# Patient Record
Sex: Male | Born: 2000 | Race: White | Hispanic: No | Marital: Single | Smoking: Never smoker
Health system: Southern US, Community
[De-identification: ages and names within clinical notes are randomized; demographics above are authoritative.]

---

## 2020-09-05 ENCOUNTER — Encounter (HOSPITAL_COMMUNITY): Payer: Self-pay | Admitting: Emergency Medicine

## 2020-09-05 ENCOUNTER — Emergency Department (HOSPITAL_COMMUNITY)
Admission: EM | Admit: 2020-09-05 | Discharge: 2020-09-05 | Disposition: A | Discharge status: Home / Self Care | Payer: Self-pay | Attending: Emergency Medicine | Admitting: Emergency Medicine

## 2020-09-05 ENCOUNTER — Other Ambulatory Visit: Payer: Self-pay

## 2020-09-05 ENCOUNTER — Emergency Department (HOSPITAL_COMMUNITY): Payer: Self-pay

## 2020-09-05 DIAGNOSIS — U071 COVID-19: Secondary | ICD-10-CM | POA: Insufficient documentation

## 2020-09-05 DIAGNOSIS — R519 Headache, unspecified: Secondary | ICD-10-CM

## 2020-09-05 LAB — RESP PANEL BY RT-PCR (FLU A&B, COVID) ARPGX2
Influenza A by PCR: NEGATIVE
Influenza B by PCR: NEGATIVE
SARS Coronavirus 2 by RT PCR: POSITIVE — AB

## 2020-09-05 NOTE — Discharge Instructions (Signed)
Please read and follow all provided instructions.  Your diagnoses today include:  1. Acute nonintractable headache, unspecified headache type   2. COVID-19     Tests performed today include: CT of your head which was normal and did not show any serious cause of your headache Vital signs. See below for your results today.   Medications:  Please use over-the-counter NSAID medications (ibuprofen, naproxen) as directed on the packaging for pain.   Take any prescribed medications only as directed.  Additional information:  Follow any educational materials contained in this packet.  You are having a headache. No specific cause was found today for your headache. It may have been a migraine or other cause of headache. Stress, anxiety, fatigue, and depression are common triggers for headaches.   Your headache today does not appear to be life-threatening or require hospitalization, but often the exact cause of headaches is not determined in the emergency department. Therefore, follow-up with your doctor is very important to find out what may have caused your headache and whether or not you need any further diagnostic testing or treatment.   Sometimes headaches can appear benign (not harmful), but then more serious symptoms can develop which should prompt an immediate re-evaluation by your doctor or the emergency department.  BE VERY CAREFUL not to take multiple medicines containing Tylenol (also called acetaminophen). Doing so can lead to an overdose which can damage your liver and cause liver failure and possibly death.   Follow-up instructions: Please follow-up with your primary care provider as needed for further evaluation of your symptoms.   Return instructions:  Please return to the Emergency Department if you experience worsening symptoms. Return if the medications do not resolve your headache, if it recurs, or if you have multiple episodes of vomiting or cannot keep down fluids. Return  if you have a change from the usual headache. RETURN IMMEDIATELY IF you: Develop a sudden, severe headache Develop confusion or become poorly responsive or faint Develop a fever above 100.73F or problem breathing Have a change in speech, vision, swallowing, or understanding Develop new weakness, numbness, tingling, incoordination in your arms or legs Have a seizure Please return if you have any other emergent concerns.  Additional Information:  Your vital signs today were: BP (!) 141/87 (BP Location: Right Arm)   Pulse 74   Temp 98.4 F (36.9 C) (Oral)   Resp 16   SpO2 100%  If your blood pressure (BP) was elevated above 135/85 this visit, please have this repeated by your doctor within one month. --------------

## 2020-09-05 NOTE — ED Provider Notes (Signed)
[facility] EMERGENCY DEPARTMENT Provider Note   CSN: [id number] Arrival date & time: 09/05/20  1335     History Chief Complaint  Patient presents with   Headache    Patrick [name] is a 20 y.o. male.  Patient presents for evaluation of left-sided posterior headaches ongoing over the past 6 days.  Patient states that he has intermittent sharp pains which are severe for 15 to 20 minutes and then more dull pain lasting for another 15 or 20 minutes to the left occipital area.  Seems to be worse when he gets his heart rate up or exercises.  No lightheadedness or syncope.  No focal neurodeficits. Patient denies signs of stroke including: facial droop, slurred speech, aphasia, weakness/numbness in extremities, imbalance/trouble walking.  He takes ibuprofen which does seem to help.  No fever, infectious symptoms, neck pain.  No history of similar headaches.  No head injury.  Recent COVID exposure to her roommate.  Denies family history of aneurysms or strokes.  The onset of this condition was acute. The course is constant. Aggravating factors: exertion. Alleviating factors: none.        History reviewed. No pertinent past medical history.  There are no problems to display for this patient.   History reviewed. No pertinent surgical history.     No family history on file.  Social History   Tobacco Use   Smoking status: Never   Smokeless tobacco: Never  Substance Use Topics   Alcohol use: Yes   Drug use: Not Currently    Home Medications Prior to Admission medications   Not on File    Allergies    Patient has no known allergies.  Review of Systems   Review of Systems  Constitutional:  Negative for fever.  HENT:  Negative for congestion, dental problem, rhinorrhea and sinus pressure.   Eyes:  Positive for photophobia. Negative for discharge, redness and visual disturbance.  Respiratory:  Negative for shortness of breath.   Cardiovascular:  Negative for chest  pain.  Gastrointestinal:  Negative for nausea and vomiting.  Musculoskeletal:  Negative for gait problem, neck pain and neck stiffness.  Skin:  Negative for rash.  Neurological:  Positive for headaches. Negative for syncope, speech difficulty, weakness, light-headedness and numbness.  Psychiatric/Behavioral:  Negative for confusion.    Physical Exam Updated Vital Signs BP (!) 141/87 (BP Location: Right Arm)   Pulse 74   Temp 98.4 F (36.9 C) (Oral)   Resp 16   SpO2 100%   Physical Exam Vitals and nursing note reviewed.  Constitutional:      Appearance: He is well-developed.  HENT:     Head: Normocephalic and atraumatic.     Right Ear: Tympanic membrane, ear canal and external ear normal.     Left Ear: Tympanic membrane, ear canal and external ear normal.     Nose: Nose normal.     Mouth/Throat:     Pharynx: Uvula midline.  Eyes:     General: Lids are normal.     Conjunctiva/sclera: Conjunctivae normal.     Pupils: Pupils are equal, round, and reactive to light.  Cardiovascular:     Rate and Rhythm: Normal rate and regular rhythm.  Pulmonary:     Effort: Pulmonary effort is normal.     Breath sounds: Normal breath sounds.  Abdominal:     Palpations: Abdomen is soft.     Tenderness: There is no abdominal tenderness.  Musculoskeletal:        General:  Normal range of motion.     Cervical back: Normal range of motion and neck supple. No tenderness or bony tenderness.     Comments: No reproducible tenderness over the occipital area or cervical spine.  Skin:    General: Skin is warm and dry.  Neurological:     Mental Status: He is alert and oriented to person, place, and time.     GCS: GCS eye subscore is 4. GCS verbal subscore is 5. GCS motor subscore is 6.     Cranial Nerves: No cranial nerve deficit.     Sensory: No sensory deficit.     Motor: No abnormal muscle tone.     Coordination: Coordination normal.     Gait: Gait normal.     Deep Tendon Reflexes: Reflexes are  normal and symmetric.    ED Results / Procedures / Treatments   Labs (all labs ordered are listed, but only abnormal results are displayed) Labs Reviewed  RESP PANEL BY RT-PCR (FLU A&B, COVID) [id number]    EKG None  Radiology No results found.  Procedures Procedures   Medications Ordered in ED Medications - No data to display  ED Course  I have reviewed the triage vital signs and the nursing notes.  Pertinent labs & imaging results that were available during my care of the patient were reviewed by me and considered in my medical decision making (see chart for details).  Patient seen and examined.  Discussed PCP/neuro follow-up and +/- head CT.  Patient would like to proceed with head CT.  Discussed that he may need additional imaging when he is able to follow-up in the future.  No significant headache at time of exam.  Vital signs reviewed and are as follows: BP (!) 141/87 (BP Location: Right Arm)   Pulse 74   Temp 98.4 F (36.9 C) (Oral)   Resp 16   SpO2 100%   6:12 PM CT negative.  Patient's COVID test did come back positive.  He had an exposure to roommate who had COVID about a week ago.  He states 3 negative at home test in the interim.  Denies URI symptoms, cough, shortness of breath, vomiting or diarrhea.  Headaches may be related to underlying COVID infection.  He otherwise appears well.  Detailed discussion had with with patient regarding COVID-19 precautions and written instructions given as well.  We discussed need to isolate themselves for 5 days from onset of symptoms and have 24 hours of improvement prior to breaking isolation.  We discussed that when breaking isolation, mask wearing for 5 additional days is required.  We discussed signs symptoms to return which include worsening shortness of breath, trouble breathing, or increased work of breathing.  Also return with persistent vomiting, confusion, passing out, or if they have any other concerns. Counseled on the  need for rest and good hydration. Discussed that high-risk contacts should be aware of positive result and they need to quarantine and be tested if they develop any symptoms. Patient verbalizes understanding.   Patrick [name] was evaluated in Emergency Department on 09/05/2020 for the symptoms described in the history of present illness. He was evaluated in the context of the global COVID-19 pandemic, which necessitated consideration that the patient might be at risk for infection with the SARS-CoV-2 virus that causes COVID-19. Institutional protocols and algorithms that pertain to the evaluation of patients at risk for COVID-19 are in a state of rapid change based on information released by regulatory bodies including the CDC  and federal and state organizations. These policies and algorithms were followed during the patient's care in the ED.    MDM Rules/Calculators/A&P                           HA: Patient with unusual headache, no focal neurologic deficits.  Worse with exertion.  Patient without high-risk features of headache including: sudden onset/thunderclap HA, altered mental status, accompanying seizure, age > 12, history of immunocompromise, neck or shoulder pain, fever, use of anticoagulation, family history of spontaneous SAH, concomitant drug use, toxic exposure.   Patient has a normal complete neurological exam, normal vital signs, normal level of consciousness, no signs of meningismus, is well-appearing/non-toxic appearing, no signs of trauma.   CT negative.  No dangerous or life-threatening conditions suspected or identified by history, physical exam, and by work-up. No indications for hospitalization identified.   COVID-19: Positive test, nearly asymptomatic.  He looks well, will continue symptomatic treatment as needed and contact precautions.  Final Clinical Impression(s) / ED Diagnoses Final diagnoses:  Acute nonintractable headache, unspecified headache type  COVID-19    Rx /  DC Orders ED Discharge Orders     None        Renne Crigler, PA-C 09/05/20 1814    Maia Plan, MD 09/06/20 [phone]

## 2020-09-05 NOTE — ED Triage Notes (Signed)
Pt with c/o of severe headache for 6 days, reports mild photo-sensitivity. No n/v or fever. Room mate had Covid a week ago. No distress.

## 2020-09-05 NOTE — ED Provider Notes (Signed)
Emergency Medicine Provider Triage Evaluation Note  Patrick [name] , a 20 y.o. male  was evaluated in triage.  Pt complains of headaches for the last week.  Review of Systems  Positive: Headache Negative: Vision changes  Physical Exam  BP (!) 141/87 (BP Location: Right Arm)   Pulse 74   Temp 98.4 F (36.9 C) (Oral)   Resp 16   SpO2 100%  Gen:   Awake, no distress   Resp:  Normal effort  MSK:   Moves extremities without difficulty  Other:  Cn II-XII intact, strength 5/5 to the bue/ble  Medical Decision Making  Medically screening exam initiated at 2:06 PM.  Appropriate orders placed.  Nicholous Senner was informed that the remainder of the evaluation will be completed by another provider, this initial triage assessment does not replace that evaluation, and the importance of remaining in the ED until their evaluation is complete.     Rayne Du 09/05/20 1406    Terald Sleeper, MD 09/05/20 [phone]

## 2020-09-05 NOTE — ED Notes (Signed)
Patient transported to CT 

## 2020-12-29 ENCOUNTER — Emergency Department (HOSPITAL_COMMUNITY): Payer: BC Managed Care – PPO

## 2020-12-29 ENCOUNTER — Other Ambulatory Visit: Payer: Self-pay

## 2020-12-29 ENCOUNTER — Emergency Department (HOSPITAL_COMMUNITY)
Admission: EM | Admit: 2020-12-29 | Discharge: 2020-12-29 | Disposition: A | Discharge status: Home / Self Care | Payer: BC Managed Care – PPO | Attending: Emergency Medicine | Admitting: Emergency Medicine

## 2020-12-29 ENCOUNTER — Encounter (HOSPITAL_COMMUNITY): Payer: Self-pay

## 2020-12-29 DIAGNOSIS — W260XXA Contact with knife, initial encounter: Secondary | ICD-10-CM | POA: Insufficient documentation

## 2020-12-29 DIAGNOSIS — Z23 Encounter for immunization: Secondary | ICD-10-CM | POA: Diagnosis not present

## 2020-12-29 DIAGNOSIS — S61512A Laceration without foreign body of left wrist, initial encounter: Secondary | ICD-10-CM | POA: Insufficient documentation

## 2020-12-29 DIAGNOSIS — S61412A Laceration without foreign body of left hand, initial encounter: Secondary | ICD-10-CM

## 2020-12-29 MED ORDER — TETANUS-DIPHTH-ACELL PERTUSSIS 5-2.5-18.5 LF-MCG/0.5 IM SUSY
0.5000 mL | PREFILLED_SYRINGE | Freq: Once | INTRAMUSCULAR | Status: AC
  Administered 2020-12-29: 0.5 mL via INTRAMUSCULAR
  Filled 2020-12-29: qty 0.5

## 2020-12-29 NOTE — ED Provider Notes (Signed)
[facility]-EMERGENCY DEPT Provider Note   CSN: [id number] Arrival date & time: 12/29/20  1705     History Chief Complaint  Patient presents with   Laceration    Patrick [name] is a 20 y.o. male with past medical history who presents for evaluation of laceration.  Suturing for a plate on drying rack had puncture wound to left wrist.  There is some bleeding.  Unknown last tetanus.  No paresthesias, weakness, decreased range of motion.  No pulsatile bleeding.  Denies additional aggravating or alleviating factors. Pain a 3/10.  History obtained from patient and past medical records.  No interpreter used  HPI     History reviewed. No pertinent past medical history.  There are no problems to display for this patient.   History reviewed. No pertinent surgical history.     Family History  Problem Relation Age of Onset   Healthy Mother    Healthy Father     Social History   Tobacco Use   Smoking status: Never   Smokeless tobacco: Never  Vaping Use   Vaping Use: Never used  Substance Use Topics   Alcohol use: Yes   Drug use: Never    Home Medications Prior to Admission medications   Not on File    Allergies    Patient has no known allergies.  Review of Systems   Review of Systems  Constitutional: Negative.   HENT: Negative.    Respiratory: Negative.    Cardiovascular: Negative.   Gastrointestinal: Negative.   Genitourinary: Negative.   Musculoskeletal: Negative.   Skin:  Positive for wound.  Neurological: Negative.   All other systems reviewed and are negative.  Physical Exam Updated Vital Signs BP (!) 118/51 (BP Location: Left Arm)   Pulse 78   Temp 98 F (36.7 C) (Oral)   Resp 18   Ht 5\' 1"  (1.549 m)   Wt 83.9 kg   SpO2 100%   BMI 34.96 kg/m   Physical Exam Vitals and nursing note reviewed.  Constitutional:      General: He is not in acute distress.    Appearance: He is well-developed. He is not ill-appearing,  toxic-appearing or diaphoretic.  HENT:     Head: Normocephalic and atraumatic.     Nose: Nose normal.     Mouth/Throat:     Mouth: Mucous membranes are moist.  Eyes:     Pupils: Pupils are equal, round, and reactive to light.  Cardiovascular:     Rate and Rhythm: Normal rate and regular rhythm.     Pulses: Normal pulses.          Radial pulses are 2+ on the right side and 2+ on the left side.     Heart sounds: Normal heart sounds.  Pulmonary:     Effort: Pulmonary effort is normal. No respiratory distress.     Breath sounds: Normal breath sounds.  Abdominal:     General: Bowel sounds are normal. There is no distension.     Palpations: Abdomen is soft.     Tenderness: There is no abdominal tenderness. There is no right CVA tenderness, left CVA tenderness, guarding or rebound.  Musculoskeletal:        General: Normal range of motion.     Cervical back: Normal range of motion and neck supple.     Comments: Full range of motion, equal grip strength.  Able to perform radial, ulnar deviation to left wrist.  Able to supinate, pronate without difficulty.  No bony tenderness to hand, wrist, tib-fib.  Skin:    General: Skin is warm and dry.     Capillary Refill: Capillary refill takes less than 2 seconds.     Comments: 3 mm puncture wound to left radial volar wrist.  No active bleeding or drainage  Neurological:     General: No focal deficit present.     Mental Status: He is alert and oriented to person, place, and time.     Comments: Intact sensation Full range of motion Equal strength bilaterally    ED Results / Procedures / Treatments   Labs (all labs ordered are listed, but only abnormal results are displayed) Labs Reviewed - No data to display  EKG None  Radiology DG Wrist Complete Left  Result Date: 12/29/2020 CLINICAL DATA:  Laceration EXAM: LEFT WRIST - COMPLETE 3+ VIEW COMPARISON:  None FINDINGS: Osseous mineralization normal. Joint spaces preserved. No fracture,  dislocation, or bone destruction. No radiopaque foreign bodies. IMPRESSION: No acute abnormalities. Electronically Signed   By: Ulyses Southward M.D.   On: 12/29/2020 18:29    Procedures .Marland KitchenLaceration Repair  Date/Time: 12/29/2020 7:18 PM Performed by: Ralph Leyden A, PA-C Authorized by: Linwood Dibbles, PA-C   Consent:    Consent obtained:  Verbal   Consent given by:  Patient   Risks discussed:  Infection, need for additional repair, pain, poor cosmetic result and poor wound healing   Alternatives discussed:  No treatment and delayed treatment Universal protocol:    Procedure explained and questions answered to patient or proxy's satisfaction: yes     Relevant documents present and verified: yes     Test results available: yes     Imaging studies available: yes     Required blood products, implants, devices, and special equipment available: yes     Site/side marked: yes     Immediately prior to procedure, a time out was called: yes     Patient identity confirmed:  Verbally with patient Anesthesia:    Anesthesia method:  None Laceration details:    Location:  Hand   Hand location:  L wrist   Length (cm):  0.3 Exploration:    Hemostasis achieved with:  Direct pressure   Imaging obtained: x-ray     Imaging outcome: foreign body not noted     Wound exploration: wound explored through full range of motion     Wound extent: no foreign bodies/material noted, no muscle damage noted, no nerve damage noted, no tendon damage noted, no underlying fracture noted and no vascular damage noted     Contaminated: no   Treatment:    Area cleansed with:  Povidone-iodine   Amount of cleaning:  Extensive   Irrigation solution:  Sterile saline Skin repair:    Repair method:  Steri-Strips and tissue adhesive   Number of Steri-Strips:  1 Approximation:    Approximation:  Close Repair type:    Repair type:  Intermediate Post-procedure details:    Dressing:  Non-adherent dressing   Procedure  completion:  Tolerated well, no immediate complications   Medications Ordered in ED Medications  Tdap (BOOSTRIX) injection 0.5 mL (0.5 mLs Intramuscular Given 12/29/20 1914)    ED Course  I have reviewed the triage vital signs and the nursing notes.  Pertinent labs & imaging results that were available during my care of the patient were reviewed by me and considered in my medical decision making (see chart for details).  Here for laceration to ulnar volar aspect left  wrist which occurred just PTA.  Unknown last tetanus, will update.  No active bleeding.  Full range of motion.  Neurovascularly intact.  X-ray shows no acute abnormality  Wound thoroughly irrigated.  Dermabond and Steri-Strip placed.  Will have follow-up with PCP for reevaluation.  Low suspicion for acute bony, tendon, ligament, vascular injury.  The patient has been appropriately medically screened and/or stabilized in the ED. I have low suspicion for any other emergent medical condition which would require further screening, evaluation or treatment in the ED or require inpatient management.  Patient is hemodynamically stable and in no acute distress.  Patient able to ambulate in department prior to ED.  Evaluation does not show acute pathology that would require ongoing or additional emergent interventions while in the emergency department or further inpatient treatment.  I have discussed the diagnosis with the patient and answered all questions.  Pain is been managed while in the emergency department and patient has no further complaints prior to discharge.  Patient is comfortable with plan discussed in room and is stable for discharge at this time.  I have discussed strict return precautions for returning to the emergency department.  Patient was encouraged to follow-up with PCP/specialist refer to at discharge.     MDM Rules/Calculators/A&P                            Final Clinical Impression(s) / ED Diagnoses Final  diagnoses:  Laceration of left hand without foreign body, initial encounter    Rx / DC Orders ED Discharge Orders     None        Lillyauna Jenkinson A, PA-C 12/29/20 1921    Terrilee Files, MD 12/30/20 1055

## 2020-12-29 NOTE — ED Notes (Signed)
An After Visit Summary was printed and given to the patient. Discharge instructions given and no further questions at this time.  

## 2020-12-29 NOTE — Discharge Instructions (Signed)
Try to keep this area dry over the next 24 hours.  After this you may let warm soapy water run over the wound.  Steri-Strip and Dermabond gradually peel up over time  Follow-up with primary care provider as needed  Return for new or worsening symptoms

## 2020-12-29 NOTE — ED Triage Notes (Signed)
Patient states he was reaching for a plate in a drying rack on the side of the sink and a knife was near and jabbed into this left inner wrist. Bleeding controlled.

## 2021-01-30 ENCOUNTER — Emergency Department (HOSPITAL_COMMUNITY)
Admission: EM | Admit: 2021-01-30 | Discharge: 2021-01-30 | Disposition: A | Discharge status: Home / Self Care | Payer: BC Managed Care – PPO | Attending: Emergency Medicine | Admitting: Emergency Medicine

## 2021-01-30 ENCOUNTER — Other Ambulatory Visit: Payer: Self-pay

## 2021-01-30 ENCOUNTER — Emergency Department (HOSPITAL_COMMUNITY): Payer: BC Managed Care – PPO

## 2021-01-30 DIAGNOSIS — W268XXA Contact with other sharp object(s), not elsewhere classified, initial encounter: Secondary | ICD-10-CM | POA: Diagnosis not present

## 2021-01-30 DIAGNOSIS — S61211A Laceration without foreign body of left index finger without damage to nail, initial encounter: Secondary | ICD-10-CM | POA: Insufficient documentation

## 2021-01-30 DIAGNOSIS — S6991XA Unspecified injury of right wrist, hand and finger(s), initial encounter: Secondary | ICD-10-CM

## 2021-01-30 MED ORDER — LIDOCAINE-EPINEPHRINE-TETRACAINE (LET) TOPICAL GEL
3.0000 mL | Freq: Once | TOPICAL | Status: AC
  Administered 2021-01-30: 12:00:00 3 mL via TOPICAL
  Filled 2021-01-30: qty 3

## 2021-01-30 NOTE — Discharge Instructions (Signed)
Please take doxycycline as previously prescribed for potential skin infection.  Cleanse wound daily and apply antibiotic cream.  Keep it wrapped for the next 2 to 3 days and then after that let it air dry for healing.  There is a potential retained foreign body in your finger based on x-ray.

## 2021-01-30 NOTE — ED Provider Notes (Signed)
[facility] EMERGENCY DEPARTMENT Provider Note   CSN: [id number] Arrival date & time: 01/30/21  0909     History Chief Complaint  Patient presents with   Hand Injury    Dontreal Dejoy is a 20 y.o. male.  The history is provided by the patient. No language interpreter was used.  Hand Injury Associated symptoms: no fever    20 year old male presents for evaluation of a hand injury.  Patient report about 5 days ago he was trying to grab onto a metal stop sign and injured his right nondominant hand in the process.  States he suffer abrasions last ulceration involving his thumb and hand.  He endorsed 3 out of 10 sharp achy pain to the affected area.  Pain is nonradiating, he has been trying to cleanse the wound and applying antibiotic ointment to it.  He did have a appointment with a telehealth 2 days ago for his injury.  He was prescribed doxycycline and clindamycin but was told to get evaluated for possible tendon infection.  His last tetanus was a month ago.  He denies any radiating pain or numbness.  He denies increasing pain with flexion and extending of his thumb.  No past medical history on file.  There are no problems to display for this patient.   No past surgical history on file.     Family History  Problem Relation Age of Onset   Healthy Mother    Healthy Father     Social History   Tobacco Use   Smoking status: Never   Smokeless tobacco: Never  Vaping Use   Vaping Use: Never used  Substance Use Topics   Alcohol use: Yes   Drug use: Never    Home Medications Prior to Admission medications   Not on File    Allergies    Patient has no known allergies.  Review of Systems   Review of Systems  Constitutional:  Negative for fever.  Skin:  Positive for wound.   Physical Exam Updated Vital Signs BP (!) 142/59 (BP Location: Right Arm)    Pulse (!) 56    Temp 98.5 F (36.9 C) (Oral)    Resp 14    SpO2 100%   Physical Exam Vitals and  nursing note reviewed.  Constitutional:      General: He is not in acute distress.    Appearance: He is well-developed.  HENT:     Head: Atraumatic.  Eyes:     Conjunctiva/sclera: Conjunctivae normal.  Musculoskeletal:        General: Signs of injury (Right hand: There is a 1 cm laceration noted to the volar aspect of first distal phalanx as well as a shallow skin avulsion noted to the volar aspect of the first MCP.  No obvious foreign body noted.  Granular tissue noted but no significant erythema edema) present.     Cervical back: Neck supple.  Skin:    Findings: No rash.  Neurological:     Mental Status: He is alert.    ED Results / Procedures / Treatments   Labs (all labs ordered are listed, but only abnormal results are displayed) Labs Reviewed - No data to display  EKG None  Radiology DG Finger Thumb Right  Result Date: 01/30/2021 CLINICAL DATA:  Right thumb laceration 5 days ago. EXAM: RIGHT THUMB 2+V COMPARISON:  None. FINDINGS: The mineralization and alignment are normal. There is no evidence of acute fracture or dislocation. The joint spaces are preserved. There is  faint density projecting over the soft tissues of the proximal thumb anteriorly on the lateral view which could reflect a small foreign body. No soft tissue emphysema. IMPRESSION: Potential small foreign body within the volar soft tissues of the proximal thumb. Correlate with area of laceration. No acute osseous findings. Electronically Signed   By: Carey Bullocks M.D.   On: 01/30/2021 09:48    Procedures Procedures   Medications Ordered in ED Medications - No data to display  ED Course  I have reviewed the triage vital signs and the nursing notes.  Pertinent labs & imaging results that were available during my care of the patient were reviewed by me and considered in my medical decision making (see chart for details).    MDM Rules/Calculators/A&P                         BP (!) 142/59 (BP Location:  Right Arm)    Pulse (!) 56    Temp 98.5 F (36.9 C) (Oral)    Resp 14    SpO2 100%      Final Clinical Impression(s) / ED Diagnoses Final diagnoses:  Injury of right hand, initial encounter    Rx / DC Orders ED Discharge Orders     None      Patient injured the skin on his right hand affecting his right thumb on the volar aspect.  He has a laceration that is not amenable for repair as it has been 5 days.  It will need to heal by secondary intention.  Has a skin avulsion on the volar aspects of his first MCP.  He does not have any findings to suggest tendon injury or tenosynovitis.  He is up-to-date with tetanus.  His wounds will be cleaned and wrapped as appropriate.  X-ray of his right thumb did shows potential small foreign body within the volar soft tissue Of the proximal thumb.  However this is not the site of his laceration and I suspect what we saw on the x-ray is likely a scab.  1:16 PM Wound were cleaned, dressing placed.  Patient made aware of potential retained foreign body.  Wound care instruction provided.  Patient does have antibiotic at home to take as previously prescribed.  Return precaution given.   Fayrene Helper, PA-C 01/30/21 1317    Arby Barrette, MD 01/30/21 [phone]

## 2021-01-30 NOTE — ED Provider Notes (Signed)
Emergency Medicine Provider Triage Evaluation Note  Patrick [name] , a 20 y.o. male  was evaluated in triage.  Pt complains of right hand laceration/abrasions from a stop sign on Thursday. The patient reports he has been cleaning it twice daily, but is concerned it might be infected. Last tetanus was 1 month ago.  Review of Systems  Positive: Abrasion/laceration Negative: Numbness,tingling,weakness, fever  Physical Exam  BP (!) 142/59 (BP Location: Right Arm)    Pulse (!) 56    Temp 98.5 F (36.9 C) (Oral)    Resp 14    SpO2 100%  Gen:   Awake, no distress   Resp:  Normal effort  MSK:   Moves extremities without difficulty, FROM of fingers, thumb, and wrist on the right. Other:  Wet abrasion noted to the inferior portion of the right thumb with superficial laceration to the superior portion.   Medical Decision Making  Medically screening exam initiated at 9:26 AM.  Appropriate orders placed.  Kolson Nippert was informed that the remainder of the evaluation will be completed by another provider, this initial triage assessment does not replace that evaluation, and the importance of remaining in the ED until their evaluation is complete.  Tetanus is up to date. Will order Xray.   Achille Rich, PA-C 01/30/21 9201    Pollyann Savoy, MD 01/30/21 [phone]

## 2021-01-30 NOTE — ED Triage Notes (Signed)
Pt reports cutting his R thumb on a metal stop sign on Thursday. Last tetanus one month ago.

## 2021-01-30 NOTE — ED Notes (Signed)
Reviewed discharge instructions with patient and mother. Follow-up care, wound care and medications reviewed. Patient and mother verbalized understanding. Patient A&Ox4, VSS, and ambulatory with steady gait upon discharge.

## 2022-01-18 IMAGING — CT CT HEAD W/O CM
4 series · 17 of 47 positions shown, 19 images · non-contrast
Comparison: None.

CLINICAL DATA: New or worsening headache. Neurological deficit.
Pain worse with exertion.

EXAM:
CT HEAD WITHOUT CONTRAST
TECHNIQUE: Contiguous axial images were obtained from the base of the skull
through the vertex without intravenous contrast.

[Series 2: head wo · axial · 0.44mm/px · z∈[+1291,+1411]mm · 7 of 32 slices shown, 9 images]
[im 4/32  brain]
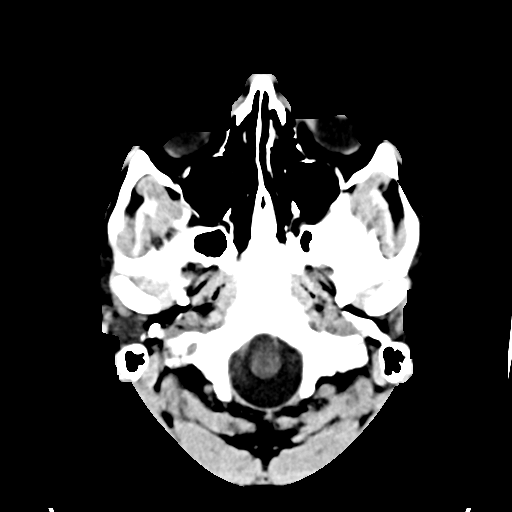
[im 4/32  bone]
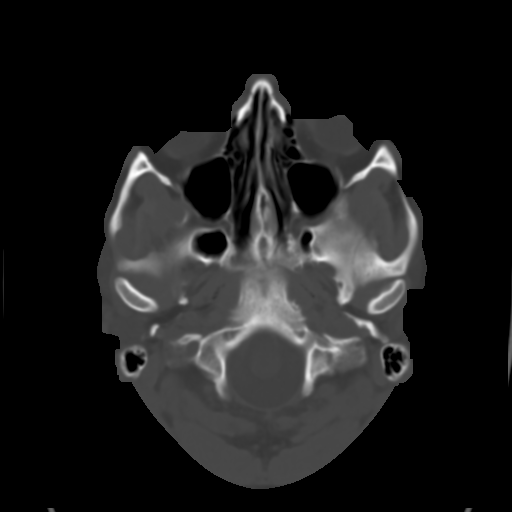
[im 8/32  brain]
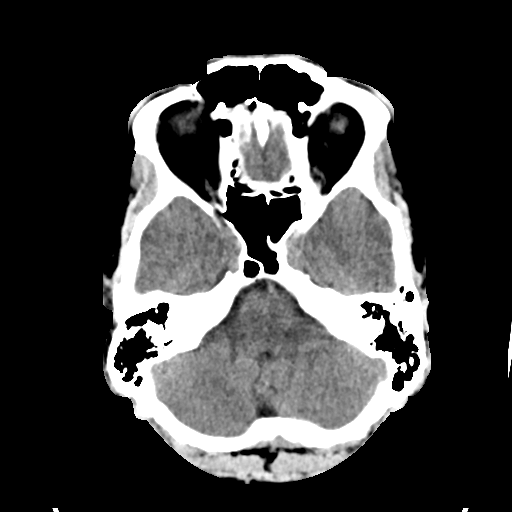
[im 12/32  brain]
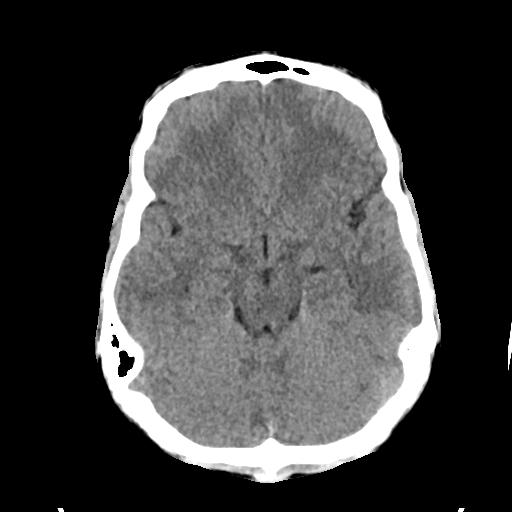
[im 16/32  brain]
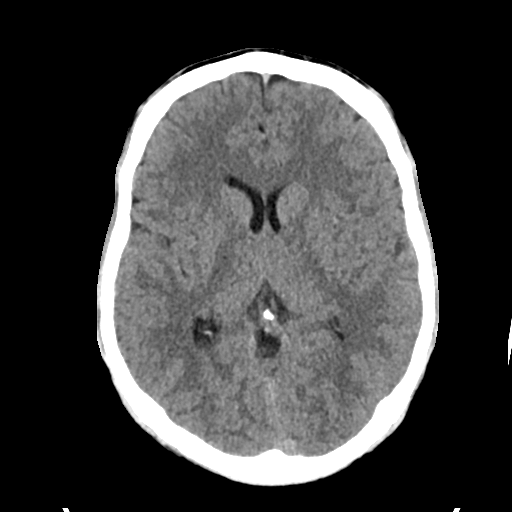
[im 20/32  brain]
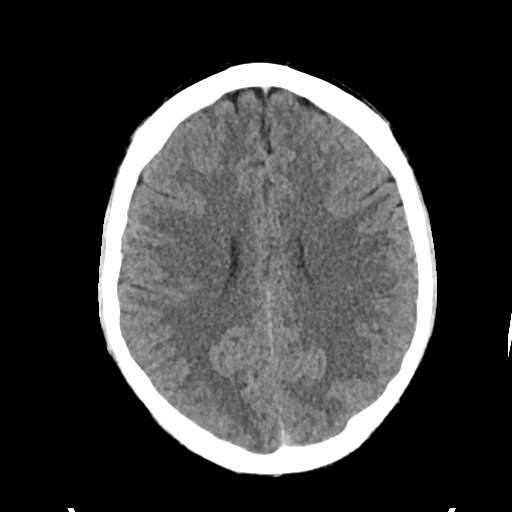
[im 20/32  bone]
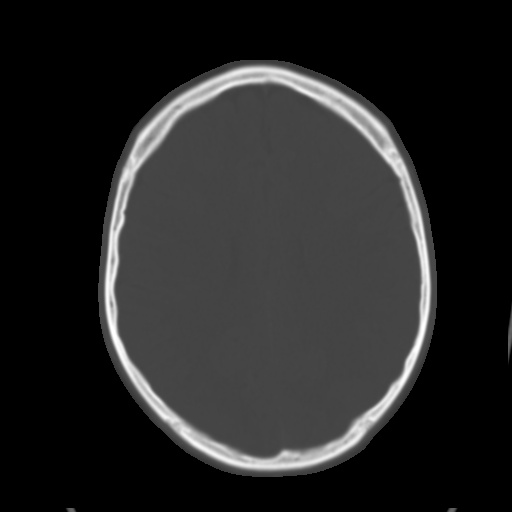
[im 24/32  brain]
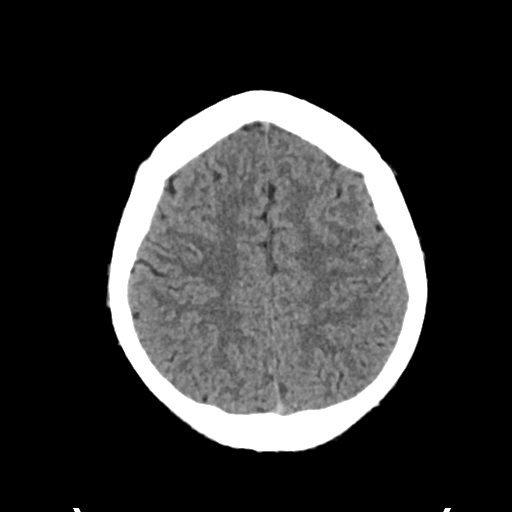
[im 28/32  brain]
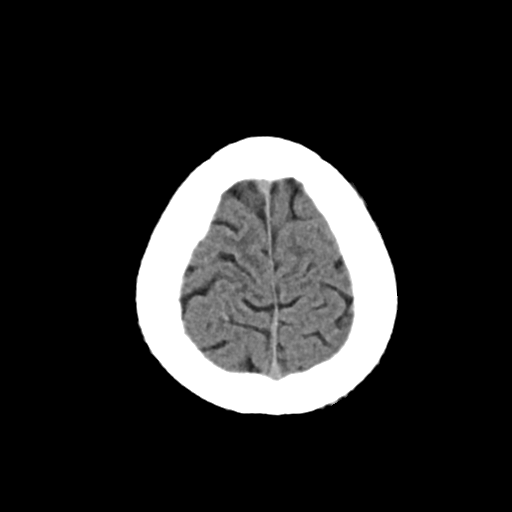

[Series 3: head bone · axial · 0.44mm/px · z∈[+1290,+1346]mm · 4 of 80 slices shown]
[im 8/80  bone]
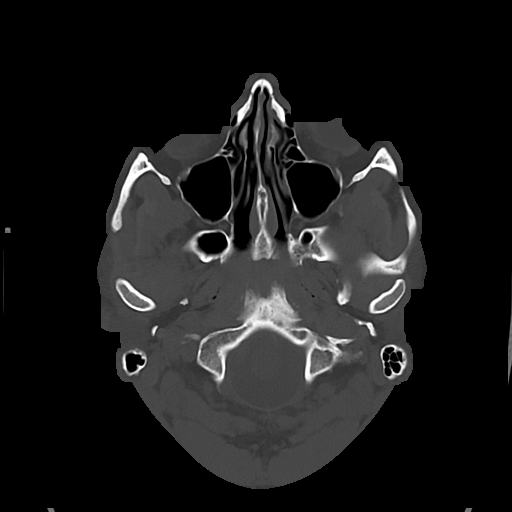
[im 16/80  bone]
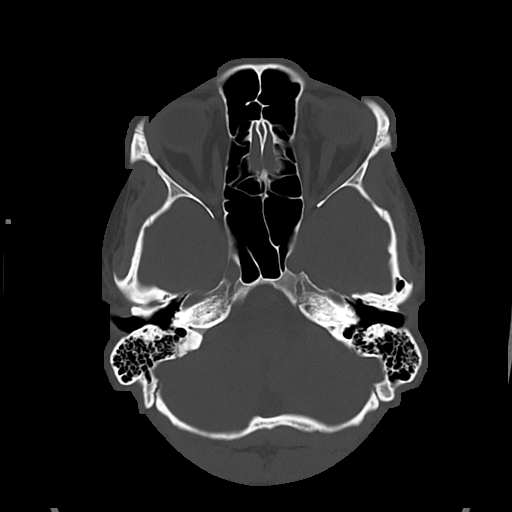
[im 24/80  bone]
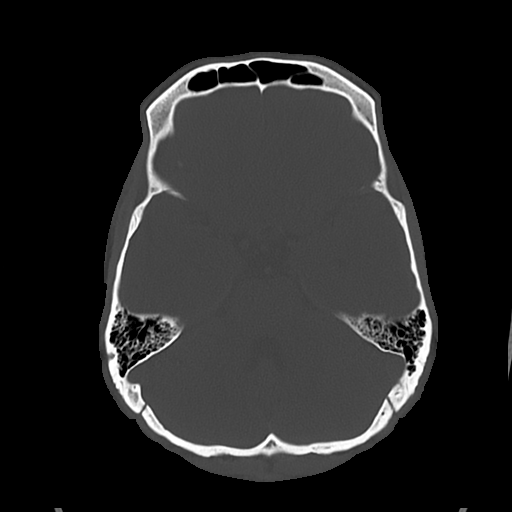
[im 36/80  bone]
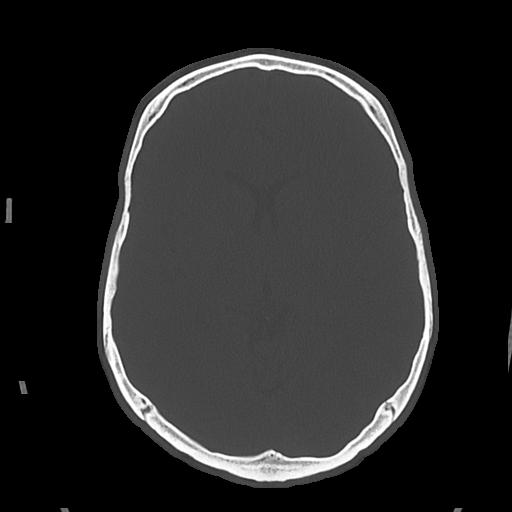

[Series 4: cor soft · coronal · 0.37mm/px · 3 of 68 slices shown]
[im 23/68  brain]
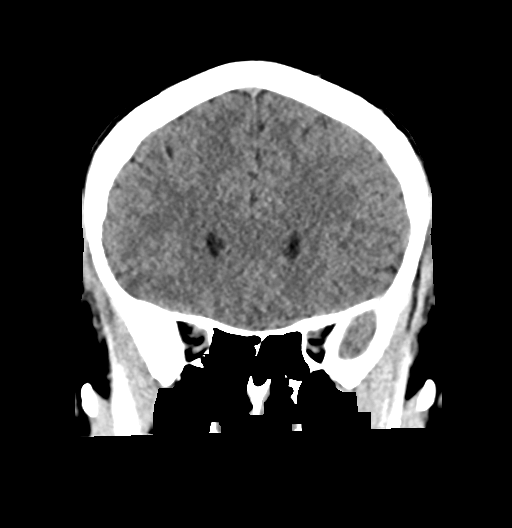
[im 30/68  brain]
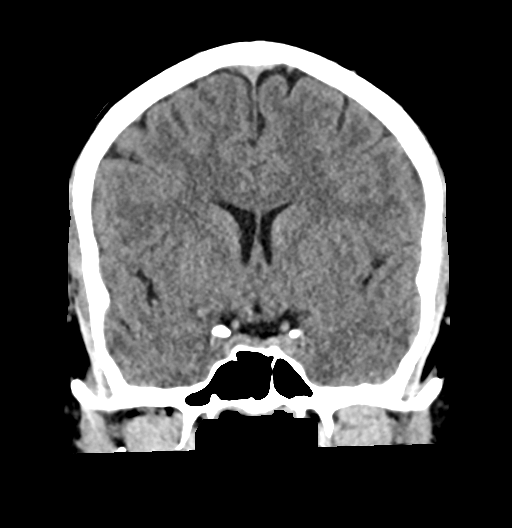
[im 38/68  brain]
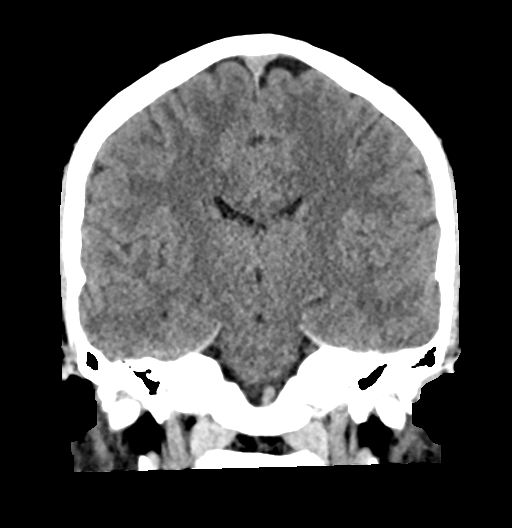

[Series 5: sag soft · sagittal · 0.41mm/px · 3 of 64 slices shown]
[im 22/64  brain]
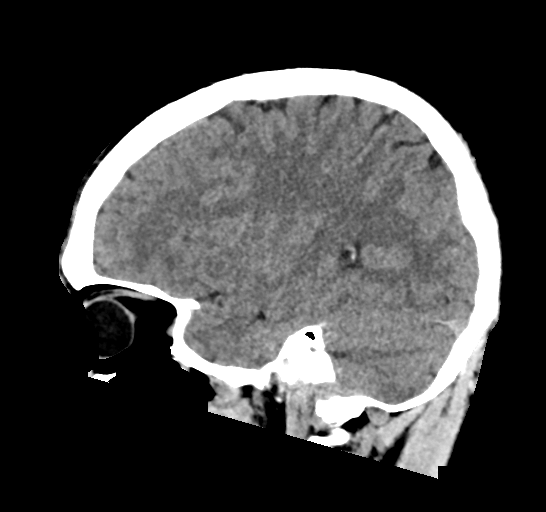
[im 32/64  brain]
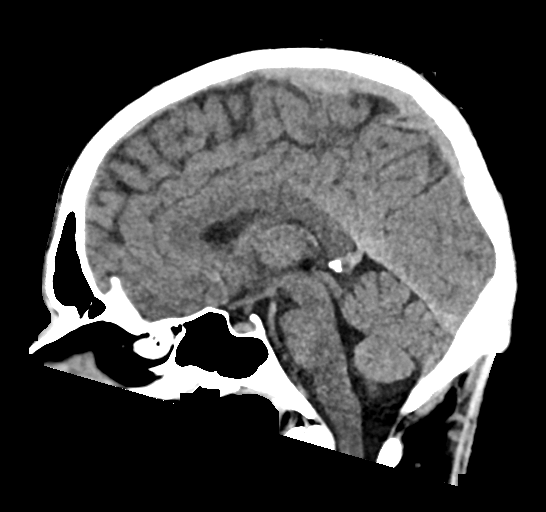
[im 43/64  brain]
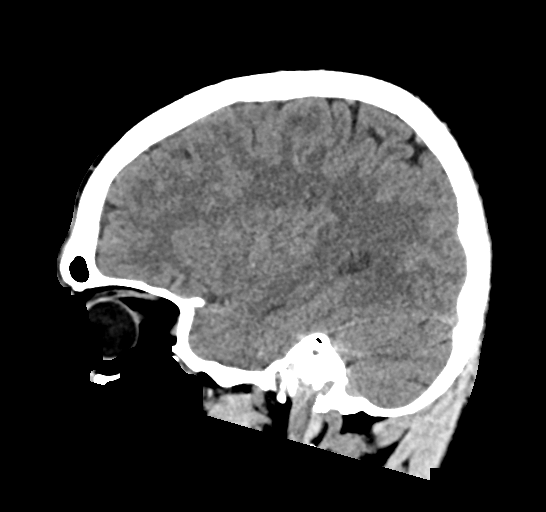

[17 of 47 positions shown; findings below may reference images not displayed]

FINDINGS: Brain: The brain shows a normal appearance without evidence of
malformation, atrophy, old or acute small or large vessel
infarction, mass lesion, hemorrhage, hydrocephalus or extra-axial
collection.

Vascular: No hyperdense vessel. No evidence of atherosclerotic
calcification.

Skull: Normal.  No traumatic finding.  No focal bone lesion.

Sinuses/Orbits: Sinuses are clear. Orbits appear normal. Mastoids
are clear.

Other: None significant
IMPRESSION: Normal head CT.  No abnormality seen to explain headache.
